# Patient Record
Sex: Male | Born: 1965 | Race: Black or African American | Hispanic: No | Marital: Single | Smoking: Former smoker
Health system: Southern US, Community
[De-identification: ages and names within clinical notes are randomized; demographics above are authoritative.]

## PROBLEM LIST (undated history)

## (undated) DIAGNOSIS — E119 Type 2 diabetes mellitus without complications: Secondary | ICD-10-CM

## (undated) DIAGNOSIS — D869 Sarcoidosis, unspecified: Secondary | ICD-10-CM

---

## 2009-08-09 ENCOUNTER — Emergency Department (HOSPITAL_COMMUNITY): Admission: EM | Admit: 2009-08-09 | Discharge: 2009-08-09 | Payer: Self-pay | Admitting: Emergency Medicine

## 2010-09-21 IMAGING — CR DG CHEST 1V PORT
1 series · 1 of 1 positions shown · non-contrast
Comparison: None

CLINICAL DATA: Shortness of breath.  Heart flutter.  Palpitations.

PORTABLE CHEST - 1 VIEW

[view not recorded]
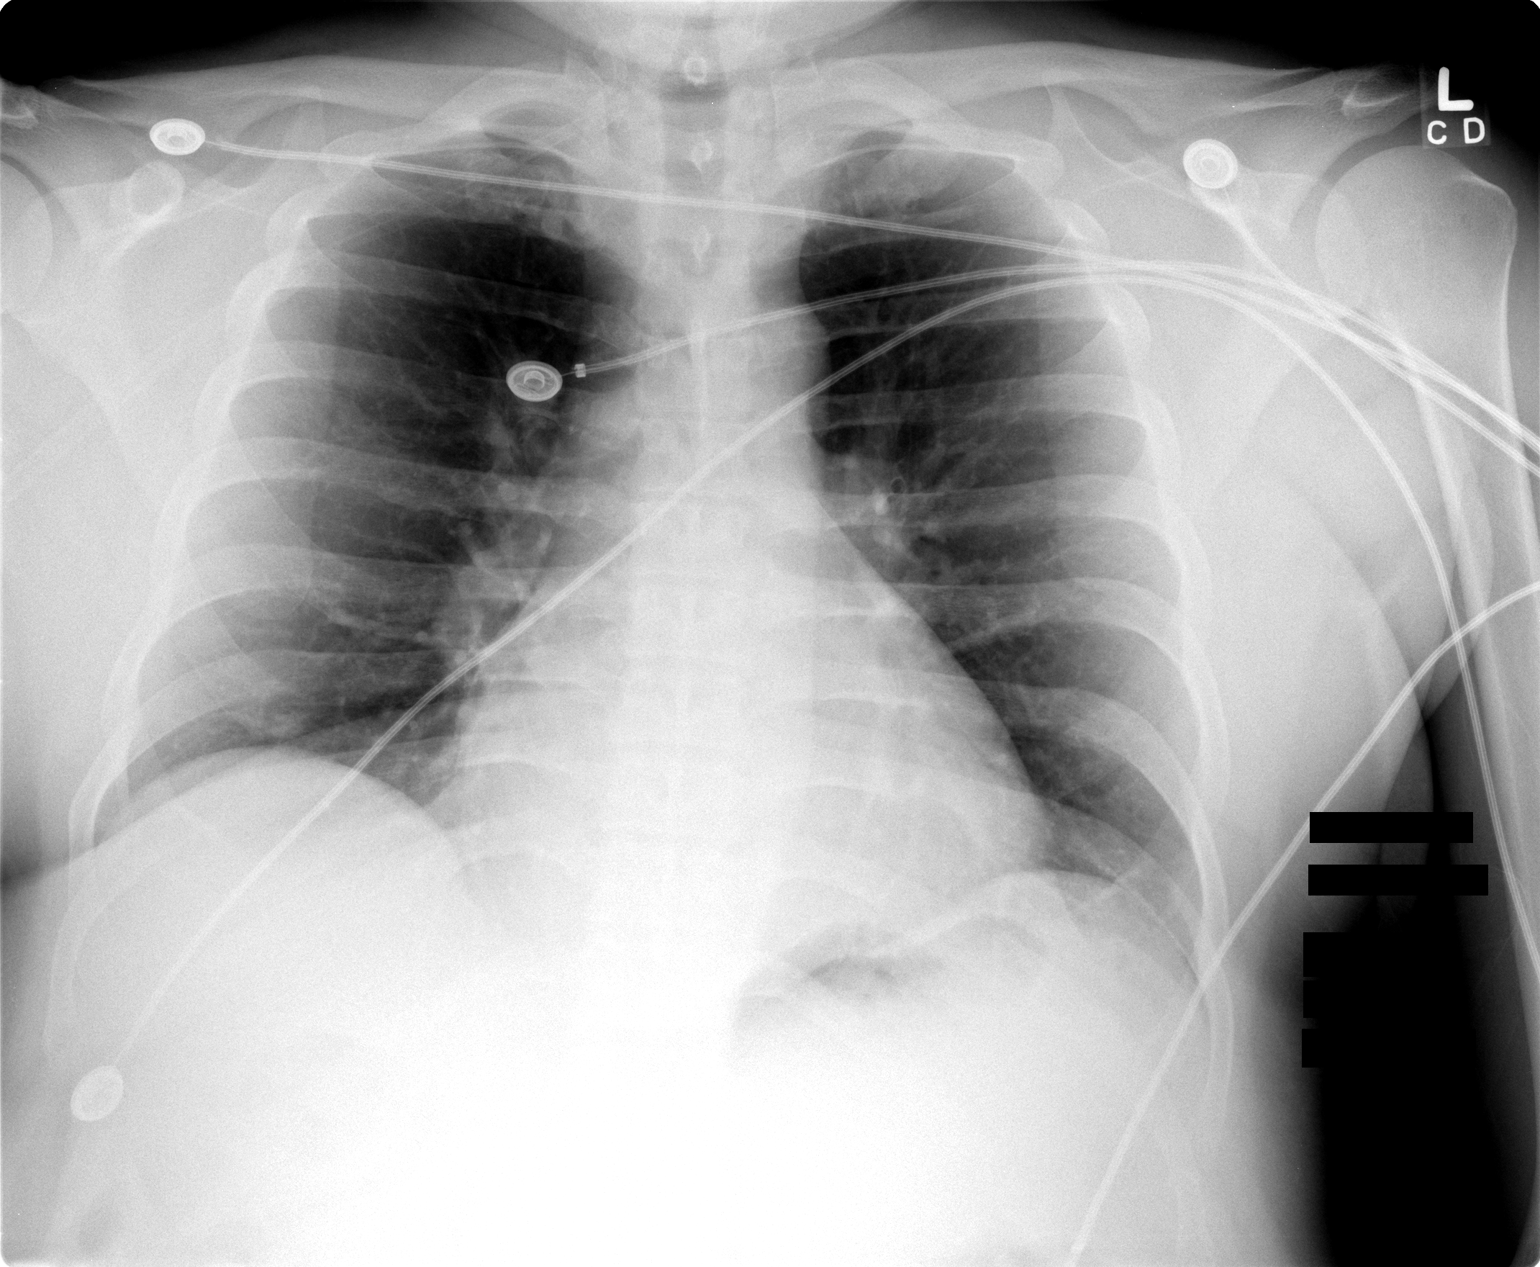

[1 of 1 positions shown; findings below may reference images not displayed]

FINDINGS: Heart size is probably upper limits normal accounting for
patient position.  There is no evidence for pulmonary edema.  No
focal consolidations or pleural effusions are identified.  There is
minimal bibasilar atelectasis.  Shallow inflation.
IMPRESSION: 1.  Heart size upper limits normal.
2.  Bibasilar atelectasis.

## 2011-02-15 LAB — DIFFERENTIAL
Basophils Relative: 0 % (ref 0–1)
Eosinophils Relative: 4 % (ref 0–5)
Lymphocytes Relative: 34 % (ref 12–46)
Monocytes Absolute: 0.5 10*3/uL (ref 0.1–1.0)
Monocytes Relative: 10 % (ref 3–12)
Neutro Abs: 2.5 10*3/uL (ref 1.7–7.7)

## 2011-02-15 LAB — BASIC METABOLIC PANEL
BUN: 12 mg/dL (ref 6–23)
CO2: 27 mEq/L (ref 19–32)
Glucose, Bld: 89 mg/dL (ref 70–99)
Potassium: 4.1 mEq/L (ref 3.5–5.1)
Sodium: 137 mEq/L (ref 135–145)

## 2011-02-15 LAB — TROPONIN I: Troponin I: <0.01 ng/mL (ref 0.00–0.06)

## 2011-02-15 LAB — CBC
HCT: 41.8 % (ref 39.0–52.0)
Hemoglobin: 14.2 g/dL (ref 13.0–17.0)
MCHC: 34 g/dL (ref 30.0–36.0)
RBC: 4.81 MIL/uL (ref 4.22–5.81)

## 2011-02-15 LAB — CK TOTAL AND CKMB (NOT AT ARMC): Relative Index: 0.9 (ref 0.0–2.5)

## 2017-07-29 ENCOUNTER — Encounter (HOSPITAL_BASED_OUTPATIENT_CLINIC_OR_DEPARTMENT_OTHER): Payer: Self-pay | Admitting: Emergency Medicine

## 2017-07-29 ENCOUNTER — Emergency Department (HOSPITAL_BASED_OUTPATIENT_CLINIC_OR_DEPARTMENT_OTHER)
Admission: EM | Admit: 2017-07-29 | Discharge: 2017-07-29 | Disposition: A | Discharge status: Home / Self Care | Payer: Self-pay | Attending: Emergency Medicine | Admitting: Emergency Medicine

## 2017-07-29 ENCOUNTER — Emergency Department (HOSPITAL_BASED_OUTPATIENT_CLINIC_OR_DEPARTMENT_OTHER): Payer: Self-pay

## 2017-07-29 DIAGNOSIS — Z79899 Other long term (current) drug therapy: Secondary | ICD-10-CM | POA: Insufficient documentation

## 2017-07-29 DIAGNOSIS — Z7984 Long term (current) use of oral hypoglycemic drugs: Secondary | ICD-10-CM | POA: Insufficient documentation

## 2017-07-29 DIAGNOSIS — E119 Type 2 diabetes mellitus without complications: Secondary | ICD-10-CM | POA: Insufficient documentation

## 2017-07-29 DIAGNOSIS — D869 Sarcoidosis, unspecified: Secondary | ICD-10-CM | POA: Insufficient documentation

## 2017-07-29 DIAGNOSIS — Z87891 Personal history of nicotine dependence: Secondary | ICD-10-CM | POA: Insufficient documentation

## 2017-07-29 DIAGNOSIS — R0602 Shortness of breath: Secondary | ICD-10-CM | POA: Insufficient documentation

## 2017-07-29 HISTORY — DX: Sarcoidosis, unspecified: D86.9

## 2017-07-29 HISTORY — DX: Type 2 diabetes mellitus without complications: E11.9

## 2017-07-29 LAB — CBC WITH DIFFERENTIAL/PLATELET
BASOS ABS: 0 10*3/uL (ref 0.0–0.1)
BASOS PCT: 0 %
EOS ABS: 0.1 10*3/uL (ref 0.0–0.7)
EOS PCT: 2 %
HCT: 39.7 % (ref 39.0–52.0)
Hemoglobin: 13.3 g/dL (ref 13.0–17.0)
Lymphocytes Relative: 40 %
Lymphs Abs: 1.5 10*3/uL (ref 0.7–4.0)
MCH: 28.9 pg (ref 26.0–34.0)
MCHC: 33.5 g/dL (ref 30.0–36.0)
MCV: 86.1 fL (ref 78.0–100.0)
MONO ABS: 0.6 10*3/uL (ref 0.1–1.0)
Monocytes Relative: 16 %
NEUTROS ABS: 1.6 10*3/uL — AB (ref 1.7–7.7)
Neutrophils Relative %: 42 %
PLATELETS: 183 10*3/uL (ref 150–400)
RBC: 4.61 MIL/uL (ref 4.22–5.81)
RDW: 14.7 % (ref 11.5–15.5)
WBC: 3.8 10*3/uL — ABNORMAL LOW (ref 4.0–10.5)

## 2017-07-29 LAB — BASIC METABOLIC PANEL
ANION GAP: 6 (ref 5–15)
BUN: 17 mg/dL (ref 6–20)
CALCIUM: 8.7 mg/dL — AB (ref 8.9–10.3)
CO2: 25 mmol/L (ref 22–32)
CREATININE: 0.87 mg/dL (ref 0.61–1.24)
Chloride: 107 mmol/L (ref 101–111)
Glucose, Bld: 87 mg/dL (ref 65–99)
Potassium: 3.5 mmol/L (ref 3.5–5.1)
Sodium: 138 mmol/L (ref 135–145)

## 2017-07-29 LAB — TROPONIN I: Troponin I: <0.03 ng/mL (ref <0.03)

## 2017-07-29 MED ORDER — PREDNISONE 20 MG PO TABS: 60.0000 mg | ORAL_TABLET | Freq: Every day | ORAL | 0 refills | Status: AC

## 2017-07-29 MED ORDER — PREDNISONE 50 MG PO TABS
60.0000 mg | ORAL_TABLET | Freq: Once | ORAL | Status: AC
  Administered 2017-07-29: 60 mg via ORAL
  Filled 2017-07-29: qty 1

## 2017-07-29 MED ORDER — PREDNISONE 20 MG PO TABS: 60.0000 mg | ORAL_TABLET | Freq: Every day | ORAL | 0 refills | Status: DC

## 2017-07-29 NOTE — ED Triage Notes (Signed)
C/o increased SOB today, recent tx with prednisone for sarcoidosis. Pt stated he finished prednisone Friday. Also reports chest tightness earlier today, states he has "the feeling of pressure" in his chest "but not tightness". Pt able to ambulate and answer questions in triage.

## 2017-07-29 NOTE — ED Provider Notes (Signed)
[facility] Note   CSN: [id number] Arrival date & time: 07/29/17  0015     History   Chief Complaint Chief Complaint  Patient presents with  . Shortness of Breath    HPI Anthony Hawkins is a 51 y.o. male.  HPI  This is a 51 year old male with history of sarcoidosis and diabetes who presents with shortness of breath.  Patient reports increasing shortness of breath today. He reports recent hospitalization and steroid treatment for sarcoidosis. He finished steroids on Friday and since that time has had worsening shortness of breath. He had chest tightness earlier today but none at the moment. Denies any recent fevers or cough. He is supposed to follow-up with his primary physician for pulmonology referral; however, he lives in [facility] and has been unable to get home secondary to weather conditions. Denies any nausea, vomiting, diarrhea, abdominal pain. Denies any lower leg swelling. No history of heart disease or heart failure.  Past Medical History:  Diagnosis Date  . Diabetes mellitus without complication (HCC)   . Sarcoidosis     There are no active problems to display for this patient.   History reviewed. No pertinent surgical history.     Home Medications    Prior to Admission medications   Medication Sig Start Date End Date Taking? Authorizing Provider  hydroxychloroquine (PLAQUENIL) 200 MG tablet Take by mouth daily.   Yes [provider]  metFORMIN (GLUCOPHAGE) 500 MG tablet Take by mouth 2 (two) times daily with a meal.   Yes [provider]  predniSONE (DELTASONE) 20 MG tablet Take 3 tablets (60 mg total) by mouth daily with breakfast. 07/29/17   Horton, Mayer Masker, MD    Family History No family history on file.  Social History Social History  Substance Use Topics  . Smoking status: Former Smoker    Quit date: 07/29/1994  . Smokeless tobacco: Never Used  . Alcohol use Yes     Comment: occasional      Allergies   Patient has no allergy information on record.   Review of Systems Review of Systems  Constitutional: Negative for fever.  Respiratory: Positive for chest tightness and shortness of breath. Negative for cough.   Cardiovascular: Negative for chest pain and leg swelling.  Gastrointestinal: Negative for abdominal pain, diarrhea, nausea and vomiting.  Genitourinary: Negative for dysuria.  All other systems reviewed and are negative.    Physical Exam Updated Vital Signs BP 115/80   Pulse 68   Resp 16   Ht  (1.702 m)   Wt 110.2 kg (243 lb)   SpO2 98%   BMI 38.06 kg/m   Physical Exam  Constitutional: He is oriented to person, place, and time. He appears well-developed and well-nourished.  Overweight  HENT:  Head: Normocephalic and atraumatic.  Cardiovascular: Normal rate, regular rhythm and normal heart sounds.   No murmur heard. Pulmonary/Chest: Effort normal and breath sounds normal. No respiratory distress. He has no wheezes.  Abdominal: Soft. Bowel sounds are normal. There is no tenderness. There is no rebound.  Musculoskeletal:  Trace bilateral lower extremity edema  Neurological: He is alert and oriented to person, place, and time.  Skin: Skin is warm and dry.  Psychiatric: He has a normal mood and affect.  Nursing note and vitals reviewed.    ED Treatments / Results  Labs (all labs ordered are listed, but only abnormal results are displayed) Labs Reviewed  CBC WITH DIFFERENTIAL/PLATELET - Abnormal; Notable for the  following:       Result Value   WBC 3.8 (*)    Neutro Abs 1.6 (*)    All other components within normal limits  BASIC METABOLIC PANEL - Abnormal; Notable for the following:    Calcium 8.7 (*)    All other components within normal limits  TROPONIN I    EKG  EKG Interpretation  Date/Time:  Tuesday July 29 2017 00:24:03 EDT Ventricular Rate:  73 PR Interval:  158 QRS Duration: 90 QT Interval:  362 QTC  Calculation: 398 R Axis:   -2 Text Interpretation:  Normal sinus rhythm Left ventricular hypertrophy ST elevation, consider early repolarization, pericarditis, or injury Abnormal ECG Confirmed by Ross Marcus ([id number]) on 07/29/2017 3:13:44 AM       Radiology Dg Chest 2 View  Result Date: 07/29/2017 CLINICAL DATA:  Shortness of breath today. Tightness and pressure. History of sarcoidosis. Previous smoker. EXAM: CHEST  2 VIEW COMPARISON:  Nine hundred twenty yet 2010 FINDINGS: Shallow inspiration with linear atelectasis in the lung bases. Heart size and pulmonary vascularity are normal. No consolidation or airspace disease. No blunting of costophrenic angles. No pneumothorax. Mediastinal contours appear intact. IMPRESSION: Shallow inspiration with linear atelectasis in the lung bases. Electronically Signed   By: Burman Nieves M.D.   On: 07/29/2017 02:45    Procedures Procedures (including critical care time)  Medications Ordered in ED Medications  predniSONE (DELTASONE) tablet 60 mg (60 mg Oral Given 07/29/17 0320)     Initial Impression / Assessment and Plan / ED Course  I have reviewed the triage vital signs and the nursing notes.  Pertinent labs & imaging results that were available during my care of the patient were reviewed by me and considered in my medical decision making (see chart for details).     Patient presents with shortness of breath. Worsening since stopping steroids on Friday. Reports history sarcoidosis and recent hospital admission for the same. He is nontoxic on exam. Vital signs are reassuring including a pulse ox of 98%. No wheezing or respiratory distress. Basic labwork obtained. EKG is nonischemic. Troponin negative. Chest x-ray shows no evidence of infiltrate or pneumothorax.  Patient is low risk for PE and PERC neg. he is ambulatory and maintains pulse ox without significant shortness of breath. Suspect given worsening shortness of breath with discontinuation  of steroids, this may be related to his known sarcoidosis. Will place on an additional burst of steroids and have him follow-up closely with his primary physician for consideration for tapering dose.  After history, exam, and medical workup I feel the patient has been appropriately medically screened and is safe for discharge home. Pertinent diagnoses were discussed with the patient. Patient was given return precautions.  Final Clinical Impressions(s) / ED Diagnoses   Final diagnoses:  SOB (shortness of breath)    New Prescriptions New Prescriptions   PREDNISONE (DELTASONE) 20 MG TABLET    Take 3 tablets (60 mg total) by mouth daily with breakfast.     Shon Baton, MD 07/29/17 [phone]

## 2017-07-29 NOTE — ED Notes (Signed)
Patient transported to X-ray 

## 2017-07-29 NOTE — Discharge Instructions (Signed)
You were seen today for shortness of breath. Your workup is largely reassuring. You need to follow-up closely with your primary Dr. for pulmonology referral. Anthony Hawkins be given 4 additional days of steroids. You may need a longer tapering dose of steroids if your shortness of breath returns or worsens.

## 2017-07-29 NOTE — ED Notes (Signed)
While pt resting, SpO2 98% and HR 84. While Pt ambulated around department, pt SpO2 94% and HR 86. Pt denies any SOB, reports SOB occurs when ambulating up stairs or carrying heavy objects. Pt resting on stretcher in NAD.

## 2018-09-10 IMAGING — DX DG CHEST 2V
2 series · 2 of 2 positions shown · non-contrast
Comparison: Nine hundred twenty yet [id number]

CLINICAL DATA: Shortness of breath today. Tightness and pressure.
History of sarcoidosis. Previous smoker.

EXAM:
CHEST  2 VIEW

[chest pa]
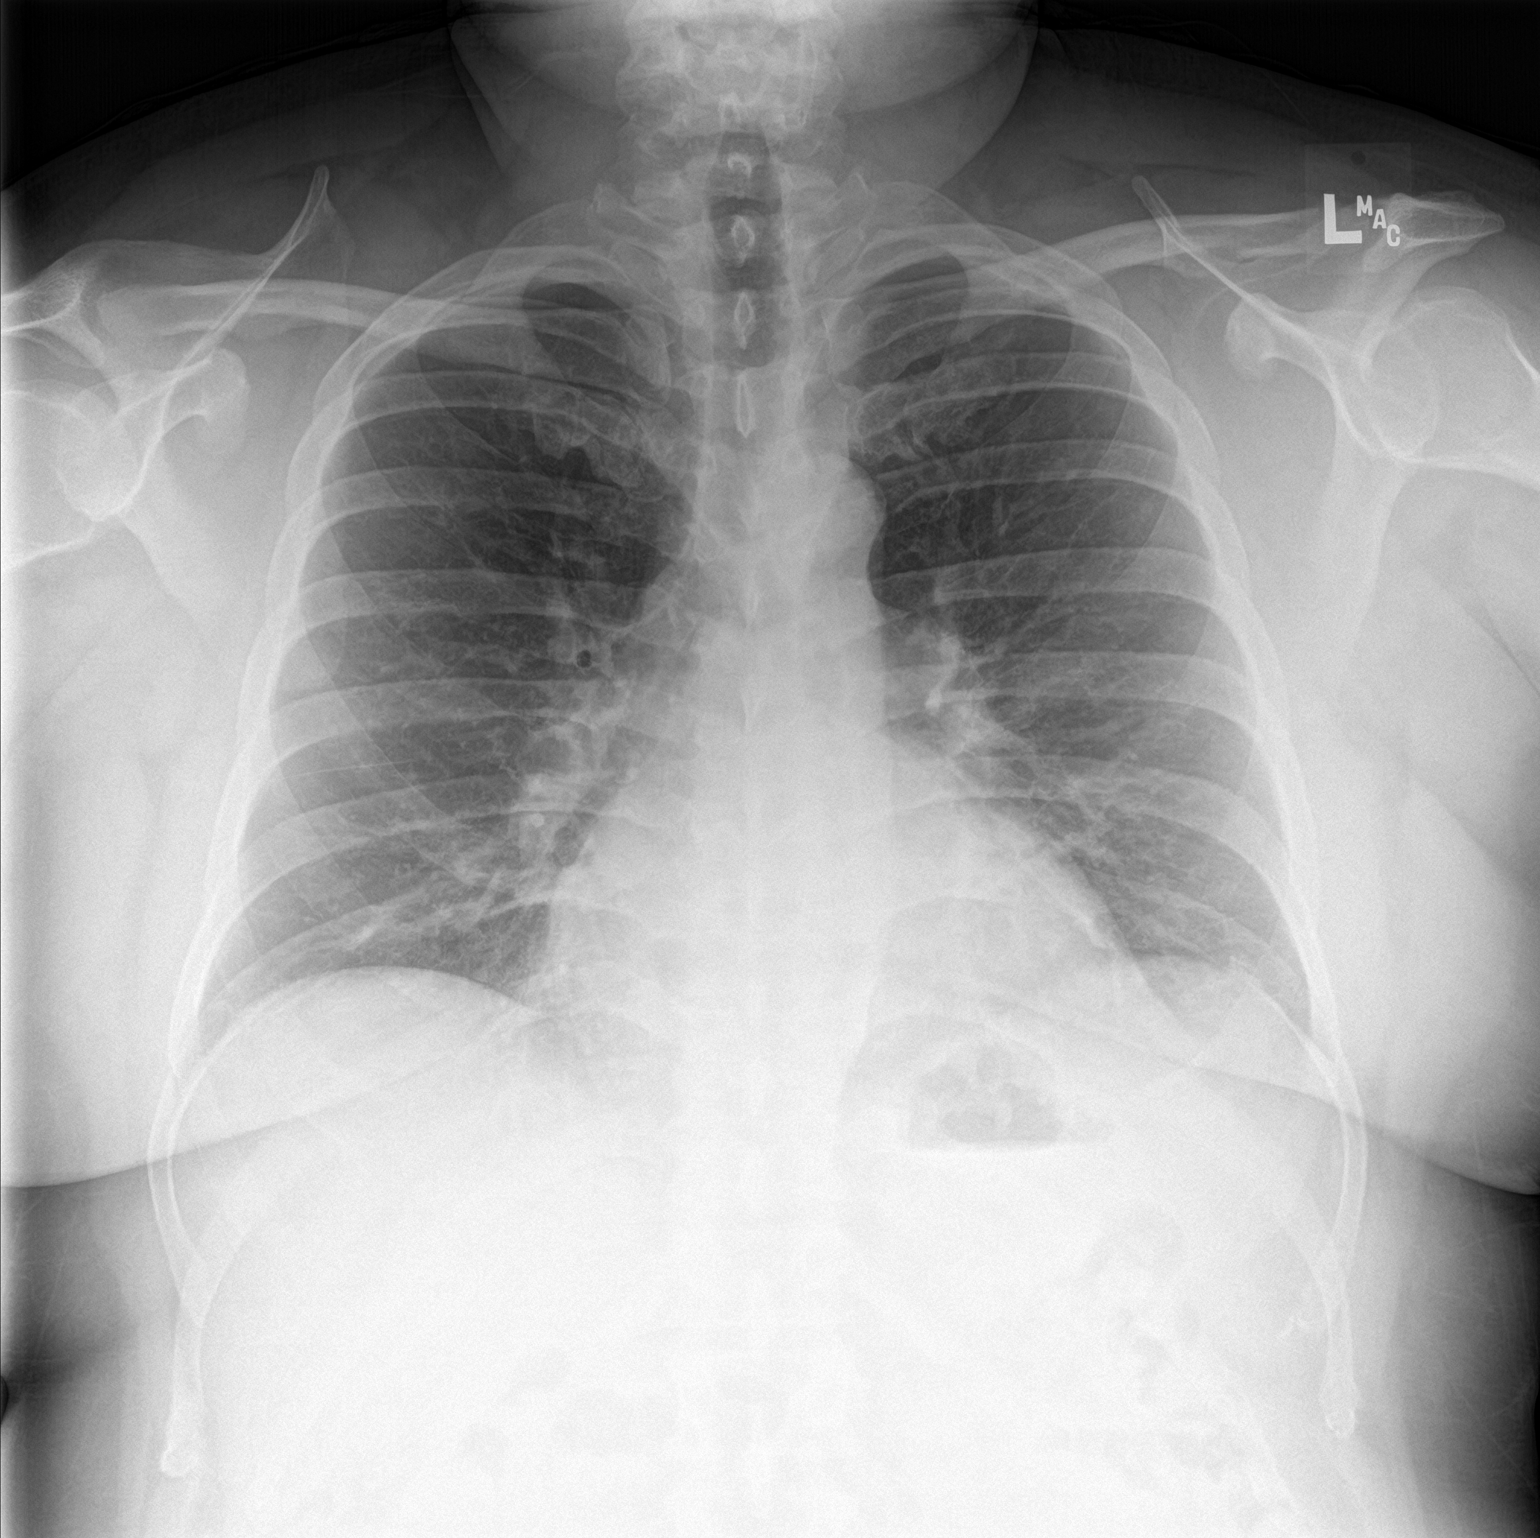

[chest lat]
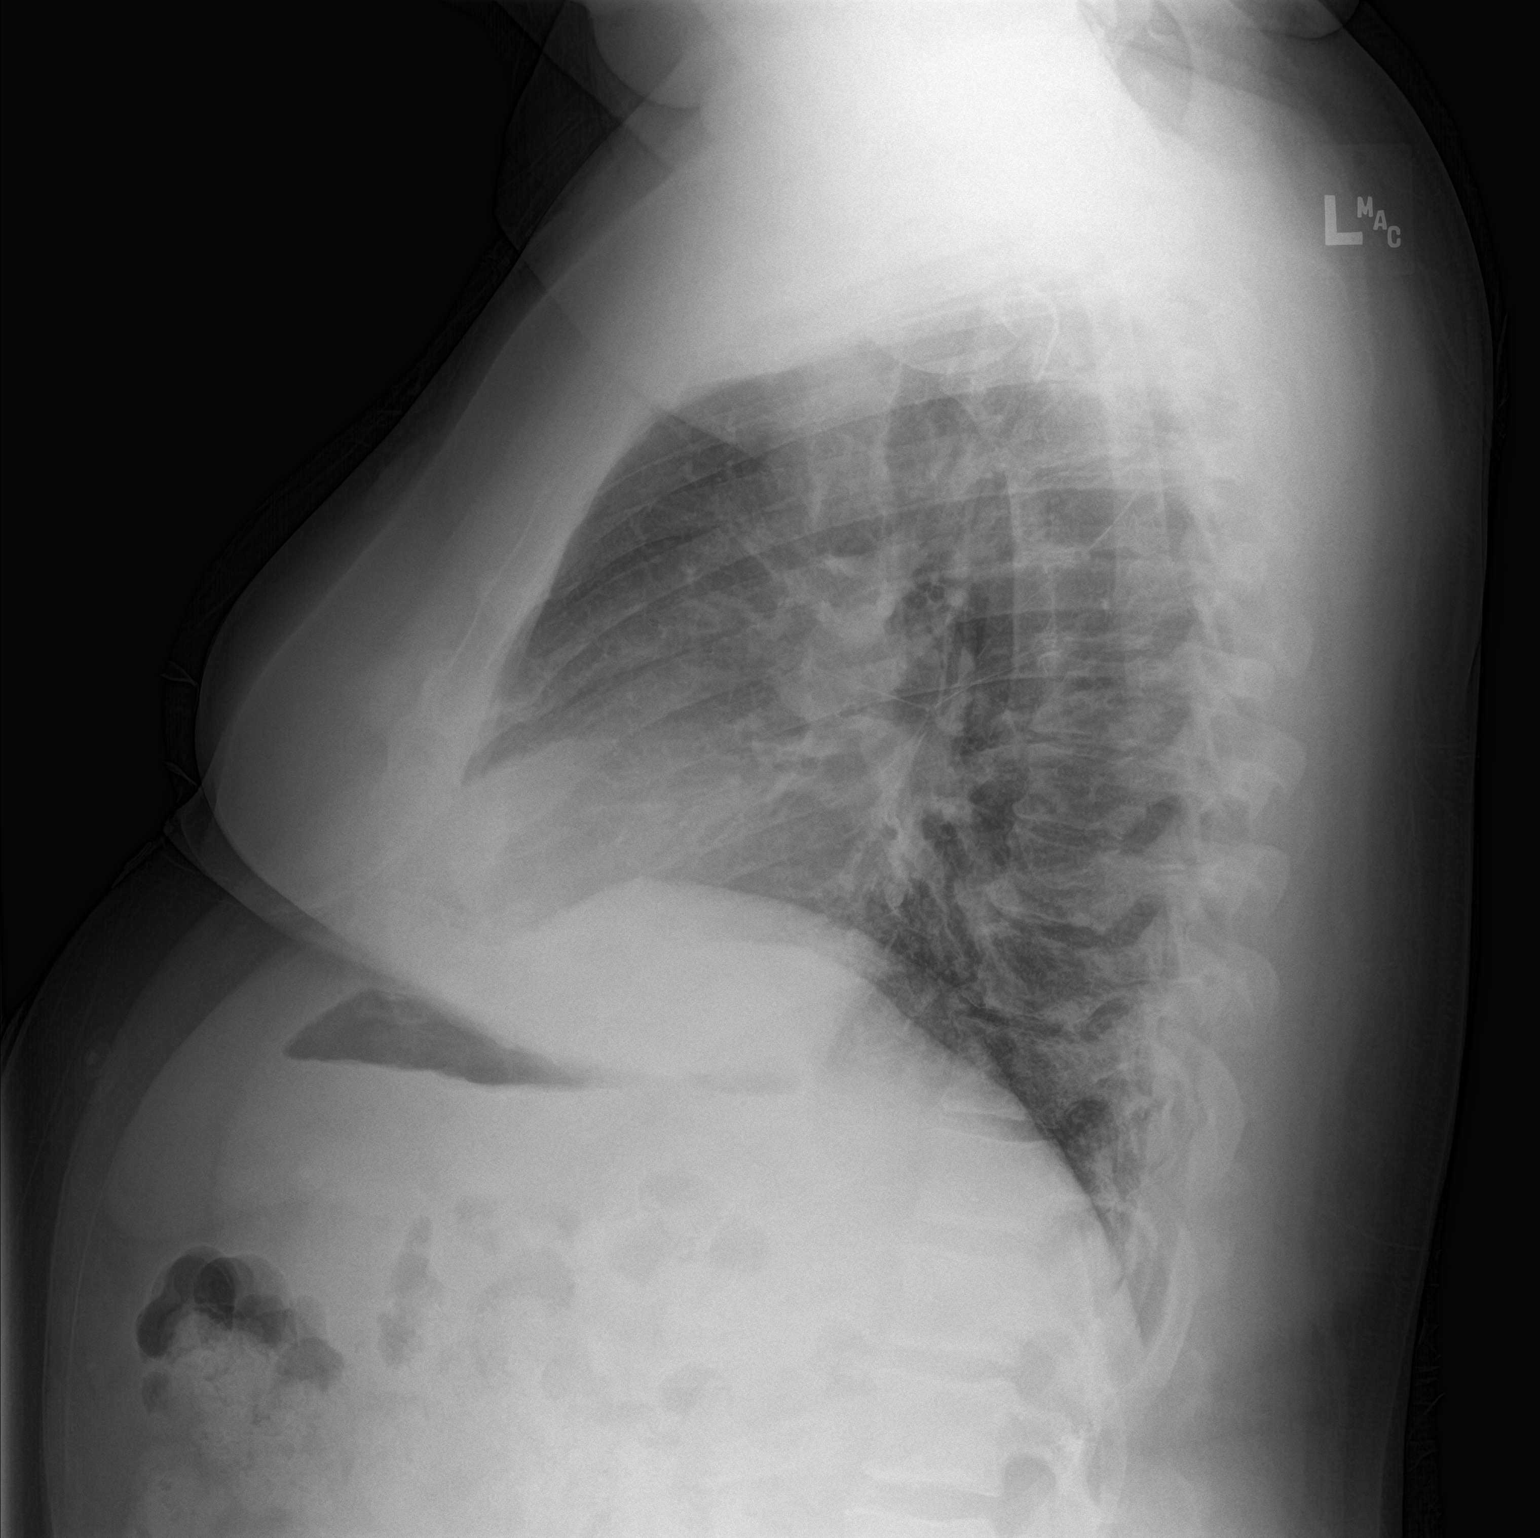

[2 of 2 positions shown; findings below may reference images not displayed]

FINDINGS: Shallow inspiration with linear atelectasis in the lung bases. Heart
size and pulmonary vascularity are normal. No consolidation or
airspace disease. No blunting of costophrenic angles. No
pneumothorax. Mediastinal contours appear intact.
IMPRESSION: Shallow inspiration with linear atelectasis in the lung bases.

## 2020-06-02 ENCOUNTER — Emergency Department (HOSPITAL_COMMUNITY): Payer: BC Managed Care – PPO

## 2020-06-02 ENCOUNTER — Emergency Department (HOSPITAL_COMMUNITY)
Admission: EM | Admit: 2020-06-02 | Discharge: 2020-06-02 | Disposition: A | Discharge status: Home / Self Care | Payer: BC Managed Care – PPO | Attending: Emergency Medicine | Admitting: Emergency Medicine

## 2020-06-02 DIAGNOSIS — Z5321 Procedure and treatment not carried out due to patient leaving prior to being seen by health care provider: Secondary | ICD-10-CM | POA: Insufficient documentation

## 2020-06-02 DIAGNOSIS — R0602 Shortness of breath: Secondary | ICD-10-CM | POA: Insufficient documentation

## 2020-06-02 LAB — CBG MONITORING, ED: Glucose-Capillary: 107 mg/dL — ABNORMAL HIGH (ref 70–99)

## 2020-06-02 NOTE — ED Triage Notes (Signed)
Pt reports sudden shob onset today. Hx asthma and pulmonary sarcoidosis. Has preventative meds but did not bring them with him.
